# Patient Record
Sex: Male | Born: 1965 | Hispanic: No | Marital: Single | Smoking: Never smoker
Health system: Southern US, Community
[De-identification: ages and names within clinical notes are randomized; demographics above are authoritative.]

## PROBLEM LIST (undated history)

## (undated) DIAGNOSIS — E785 Hyperlipidemia, unspecified: Secondary | ICD-10-CM

## (undated) DIAGNOSIS — I10 Essential (primary) hypertension: Secondary | ICD-10-CM

## (undated) DIAGNOSIS — G4733 Obstructive sleep apnea (adult) (pediatric): Secondary | ICD-10-CM

## (undated) DIAGNOSIS — K219 Gastro-esophageal reflux disease without esophagitis: Secondary | ICD-10-CM

## (undated) HISTORY — DX: Essential (primary) hypertension: I10

## (undated) HISTORY — PX: SEPTOPLASTY: SUR1290

## (undated) HISTORY — DX: Gastro-esophageal reflux disease without esophagitis: K21.9

## (undated) HISTORY — DX: Obstructive sleep apnea (adult) (pediatric): G47.33

## (undated) HISTORY — DX: Hyperlipidemia, unspecified: E78.5

---

## 2006-10-14 ENCOUNTER — Ambulatory Visit: Payer: Self-pay | Admitting: Family Medicine

## 2007-10-16 ENCOUNTER — Ambulatory Visit: Payer: Self-pay | Admitting: Unknown Physician Specialty

## 2007-12-27 ENCOUNTER — Ambulatory Visit: Payer: Self-pay | Admitting: Family Medicine

## 2008-05-29 ENCOUNTER — Ambulatory Visit: Payer: Self-pay | Admitting: Family Medicine

## 2008-11-08 ENCOUNTER — Ambulatory Visit: Payer: Self-pay | Admitting: Family Medicine

## 2009-04-14 ENCOUNTER — Encounter: Payer: Self-pay | Admitting: Cardiovascular Disease

## 2009-04-14 LAB — CONVERTED CEMR LAB
ALT: 40 units/L
AST: 27 units/L
Albumin: 5 g/dL
Alkaline Phosphatase: 63 units/L
BUN: 15 mg/dL
Basophils Relative: 1 %
CO2: 26 meq/L
Chloride: 103 meq/L
Creatinine, Ser: 1.17 mg/dL
Eosinophils Relative: 3 %
HCT: 46.6 %
HDL: 49 mg/dL
Monocytes Relative: 9 %
RBC: 4.96 M/uL
Total Protein: 7.2 g/dL
Triglyceride fasting, serum: 132 mg/dL
WBC: 7.2 10*3/uL

## 2010-06-01 ENCOUNTER — Encounter: Payer: Self-pay | Admitting: Cardiovascular Disease

## 2010-06-01 DIAGNOSIS — I10 Essential (primary) hypertension: Secondary | ICD-10-CM

## 2010-06-01 DIAGNOSIS — E785 Hyperlipidemia, unspecified: Secondary | ICD-10-CM | POA: Insufficient documentation

## 2010-06-01 DIAGNOSIS — E663 Overweight: Secondary | ICD-10-CM

## 2010-06-03 ENCOUNTER — Encounter: Payer: Self-pay | Admitting: Cardiovascular Disease

## 2011-01-11 NOTE — Letter (Signed)
Summary: Historic Patient File  Historic Patient File   Imported By: [place] 06/03/2010 12:19:52  _____________________________________________________________________  External Attachment:    Type:   Image     Comment:   External Document

## 2011-11-07 ENCOUNTER — Ambulatory Visit: Payer: Self-pay | Admitting: Urology

## 2011-11-10 ENCOUNTER — Ambulatory Visit (INDEPENDENT_AMBULATORY_CARE_PROVIDER_SITE_OTHER): Payer: BC Managed Care – PPO | Admitting: Cardiovascular Disease

## 2011-11-10 ENCOUNTER — Ambulatory Visit: Payer: Self-pay | Admitting: Cardiovascular Disease

## 2011-11-10 ENCOUNTER — Encounter: Payer: Self-pay | Admitting: Cardiovascular Disease

## 2011-11-10 DIAGNOSIS — I10 Essential (primary) hypertension: Secondary | ICD-10-CM

## 2011-11-10 DIAGNOSIS — R002 Palpitations: Secondary | ICD-10-CM | POA: Insufficient documentation

## 2011-11-10 DIAGNOSIS — E663 Overweight: Secondary | ICD-10-CM

## 2011-11-10 DIAGNOSIS — R0789 Other chest pain: Secondary | ICD-10-CM

## 2011-11-10 DIAGNOSIS — R0602 Shortness of breath: Secondary | ICD-10-CM

## 2011-11-10 DIAGNOSIS — E785 Hyperlipidemia, unspecified: Secondary | ICD-10-CM

## 2011-11-10 NOTE — Assessment & Plan Note (Signed)
Cholesterol is at goal on the current lipid regimen. No changes to the medications were made.  

## 2011-11-10 NOTE — Assessment & Plan Note (Signed)
I've asked him to watch his blood pressure and if he continues to lose weight, we might have to cut the lisinopril in half.

## 2011-11-10 NOTE — Patient Instructions (Signed)
You are doing well. No medication changes were made.  Please call us if you have new issues that need to be addressed before your next appt.  The office will contact you for a follow up Appt. In 12 months  

## 2011-11-10 NOTE — Assessment & Plan Note (Signed)
We have complemented him on his weight loss and more strict diet.

## 2011-11-10 NOTE — Progress Notes (Signed)
Patient ID: Bradley Park, male    DOB: 01-22-1966, 45 y.o.   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 He does have a very bad cold and his visit was abbreviated.   He reports that he was recently on a trip to a [place] and he had a full day of hiking, other activities, using a spot, overeating and alcohol. Later that evening he felt dizzy and had to lie down and take a cool shower. He walked 1/2 mile to dinner and felt some arrhythmia. He had additional symptoms the next day that waxed and waned. He flew on that day and has since felt well with no further symptoms. He feels that maybe he overdid it and could have had some arrhythmia.  Otherwise he has been very well, is exercising and has lost weight, approximately 20 pounds.  EKG shows normal sinus rhythm with rate 76 beats per minute with incomplete right bundle branch block, no other significant ST or T wave changes  Previous stress test in July 2009 showing diaphragmatic attenuation artifact, no ischemia, ejection fraction 70%   Outpatient Encounter Prescriptions as of 11/10/2011  Medication Sig Dispense Refill  . lisinopril (PRINIVIL,ZESTRIL) 10 MG tablet Take 10 mg by mouth daily.        . pantoprazole (PROTONIX) 20 MG tablet Take 20 mg by mouth daily.        . rosuvastatin (CRESTOR) 10 MG tablet Take 10 mg by mouth daily.        Marland Kitchen testosterone cypionate (DEPOTESTOTERONE CYPIONATE) 200 MG/ML injection Inject 200 mg into the muscle every 14 (fourteen) days.           Review of Systems  Constitutional: Negative.   HENT: Negative.   Eyes: Negative.   Respiratory: Negative.   Cardiovascular: Positive for palpitations.  Gastrointestinal: Negative.   Musculoskeletal: Negative.   Skin: Negative.   Neurological: Positive for dizziness.  Hematological:  Negative.   Psychiatric/Behavioral: Negative.   All other systems reviewed and are negative.    BP 122/86  Pulse 76  Ht 6' (1.829 m)  Wt 251 lb 4 oz (113.966 kg)  BMI 34.08 kg/m2  Physical Exam  Nursing note and vitals reviewed. Constitutional: He is oriented to person, place, and time. He appears well-developed and well-nourished.  HENT:  Head: Normocephalic.  Nose: Nose normal.  Mouth/Throat: Oropharynx is clear and moist.  Eyes: Conjunctivae are normal. Pupils are equal, round, and reactive to light.  Neck: Normal range of motion. Neck supple. No JVD present.  Cardiovascular: Normal rate, regular rhythm, S1 normal, S2 normal, normal heart sounds and intact distal pulses.  Exam reveals no gallop and no friction rub.   No murmur heard. Pulmonary/Chest: Effort normal and breath sounds normal. No respiratory distress. He has no wheezes. He has no rales. He exhibits no tenderness.  Abdominal: Soft. Bowel sounds are normal. He exhibits no distension. There is no tenderness.  Musculoskeletal: Normal range of motion. He exhibits no edema and no tenderness.  Lymphadenopathy:    He has no cervical adenopathy.  Neurological: He is alert and oriented to person, place, and time. Coordination normal.  Skin: Skin is warm and dry. No rash noted. No erythema.  Psychiatric: He has a normal mood and affect. His behavior is normal. Judgment and thought content normal.  Assessment and Plan

## 2011-11-10 NOTE — Assessment & Plan Note (Signed)
Etiology of his dizziness and palpitations it is uncertain. Certainly could have been ectopy, possibly even atrial fibrillation. He can't he feels well and has no further symptoms. We have suggested to him that if he has additional episodes, that he call the office for a Holter monitor.

## 2012-08-02 IMAGING — CR DG CHEST 2V
1 series · 2 of 2 positions shown · non-contrast
Comparison: none

REASON FOR EXAM: sob
COMMENTS:

PROCEDURE:     DXR - DXR CHEST PA (OR AP) AND LATERAL  - November 07, 2011  [DATE]
RESULT:     Comparison: 12/27/2007

[Series 1: view not recorded · 0.17mm/px · 2 of 2 slices shown]
[im 1/2]
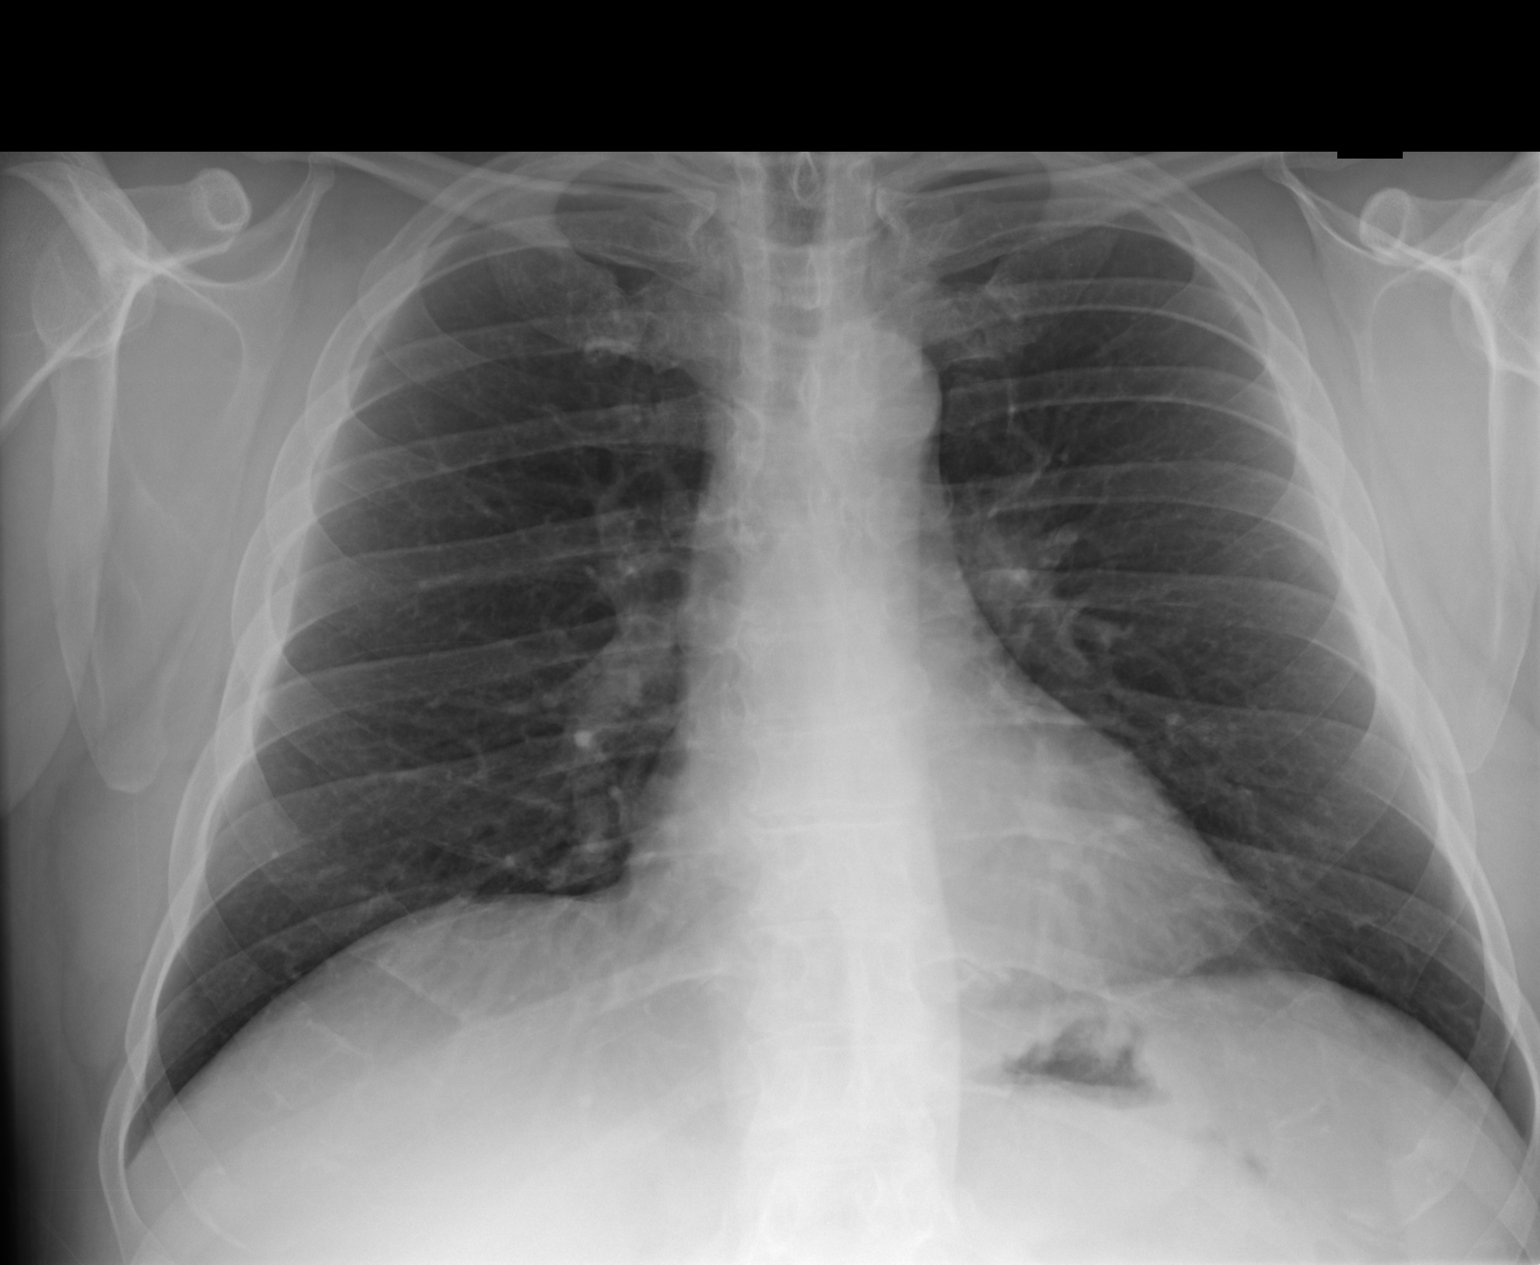
[im 2/2]
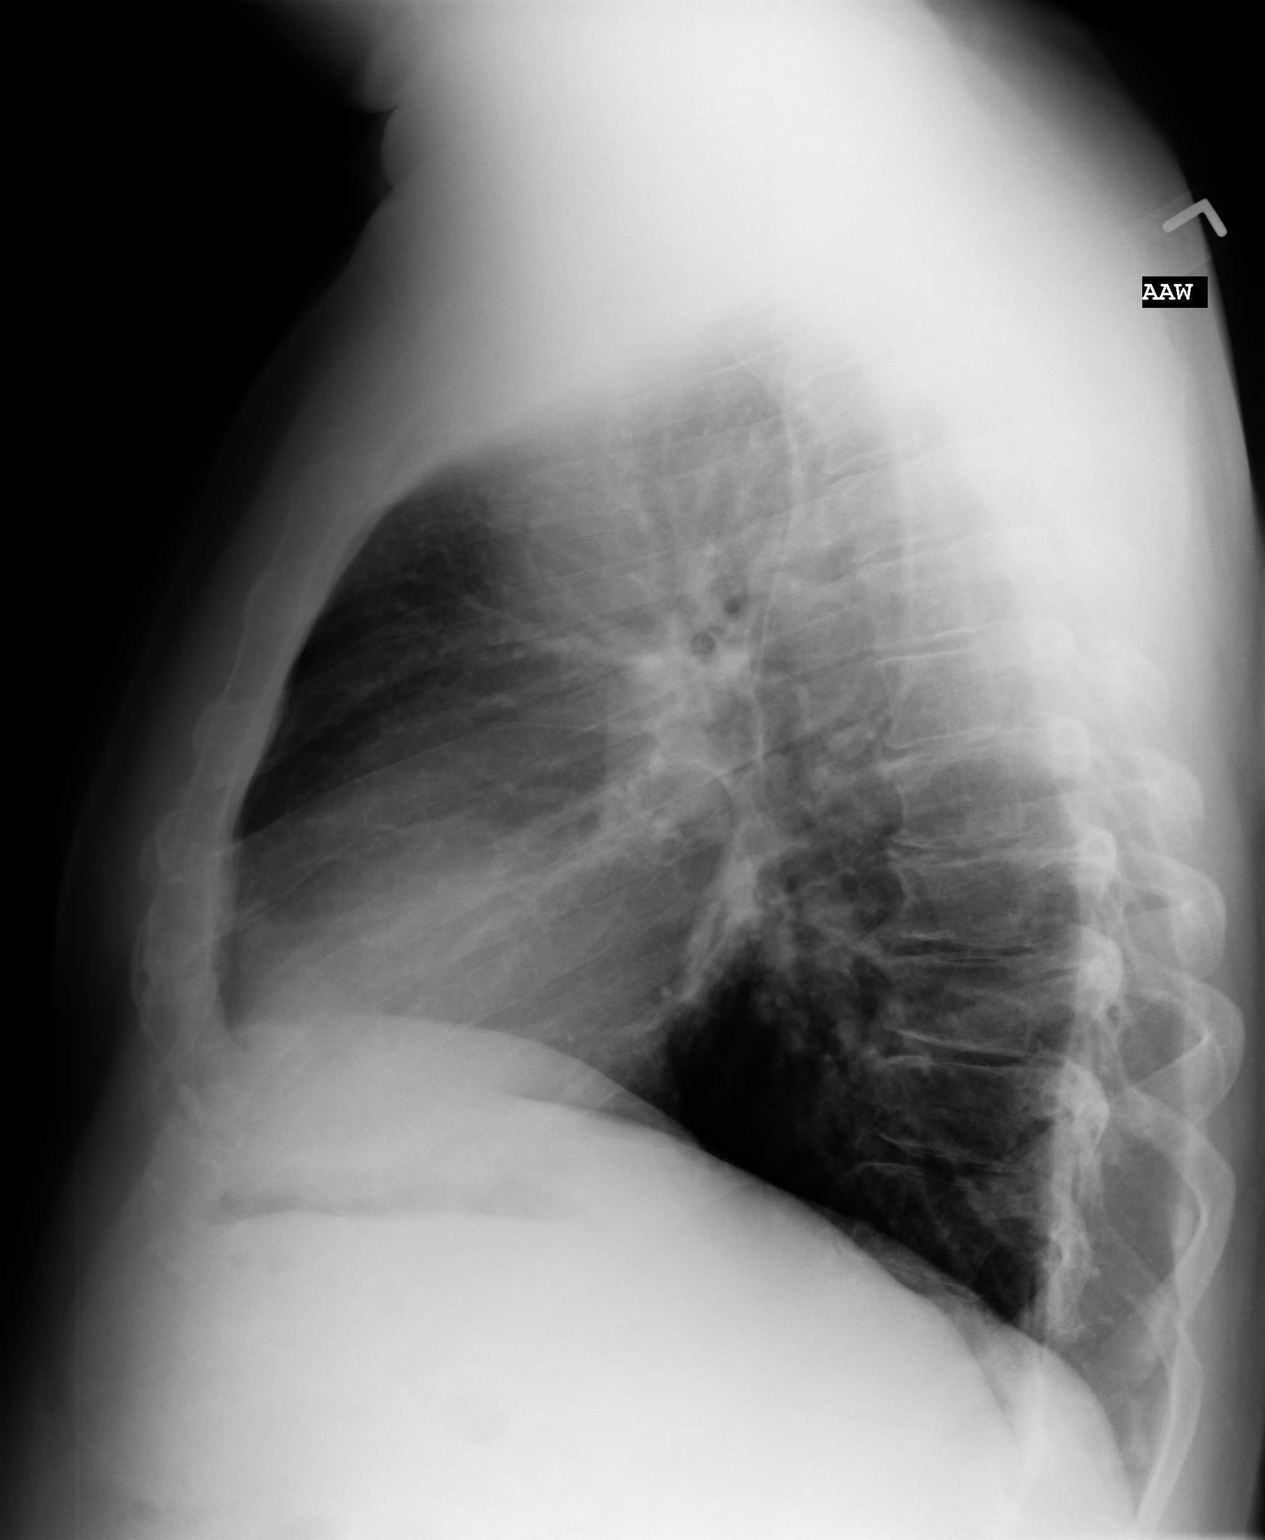

[2 of 2 positions shown; findings below may reference images not displayed]

FINDINGS: The heart and mediastinum are stable. The lungs are clear.
IMPRESSION: No acute cardiopulmonary disease.

## 2014-03-20 ENCOUNTER — Ambulatory Visit: Payer: Self-pay | Admitting: Oncology

## 2014-03-21 ENCOUNTER — Ambulatory Visit: Payer: Self-pay | Admitting: Oncology

## 2014-04-11 ENCOUNTER — Ambulatory Visit: Payer: Self-pay | Admitting: Oncology

## 2015-04-29 ENCOUNTER — Ambulatory Visit: Payer: BC Managed Care – PPO | Attending: Otolaryngology

## 2015-04-29 DIAGNOSIS — G4733 Obstructive sleep apnea (adult) (pediatric): Secondary | ICD-10-CM | POA: Insufficient documentation

## 2015-11-24 ENCOUNTER — Other Ambulatory Visit: Payer: Self-pay

## 2015-11-24 MED ORDER — LISINOPRIL 10 MG PO TABS: 10.0000 mg | ORAL_TABLET | Freq: Every day | ORAL | Status: DC

## 2015-11-24 MED ORDER — ROSUVASTATIN CALCIUM 10 MG PO TABS: 10.0000 mg | ORAL_TABLET | Freq: Every day | ORAL | Status: DC

## 2015-12-24 ENCOUNTER — Other Ambulatory Visit: Payer: Self-pay

## 2016-01-20 ENCOUNTER — Other Ambulatory Visit: Payer: Self-pay

## 2016-01-20 MED ORDER — PANTOPRAZOLE SODIUM 20 MG PO TBEC: 20.0000 mg | DELAYED_RELEASE_TABLET | Freq: Every day | ORAL | Status: DC

## 2016-02-29 ENCOUNTER — Other Ambulatory Visit: Payer: Self-pay | Admitting: Family Medicine

## 2016-02-29 ENCOUNTER — Other Ambulatory Visit: Payer: Self-pay

## 2016-02-29 MED ORDER — LISINOPRIL 10 MG PO TABS: 10.0000 mg | ORAL_TABLET | Freq: Every day | ORAL | Status: DC

## 2016-03-08 ENCOUNTER — Ambulatory Visit (INDEPENDENT_AMBULATORY_CARE_PROVIDER_SITE_OTHER): Payer: BC Managed Care – PPO | Admitting: Family Medicine

## 2016-03-08 ENCOUNTER — Encounter: Payer: Self-pay | Admitting: Family Medicine

## 2016-03-08 VITALS — BP 138/88 | HR 80 | Ht 72.0 in | Wt 259.0 lb

## 2016-03-08 DIAGNOSIS — Z0000 Encounter for general adult medical examination without abnormal findings: Secondary | ICD-10-CM | POA: Diagnosis not present

## 2016-03-08 DIAGNOSIS — R351 Nocturia: Secondary | ICD-10-CM

## 2016-03-08 DIAGNOSIS — E291 Testicular hypofunction: Secondary | ICD-10-CM | POA: Diagnosis not present

## 2016-03-08 DIAGNOSIS — E785 Hyperlipidemia, unspecified: Secondary | ICD-10-CM | POA: Diagnosis not present

## 2016-03-08 DIAGNOSIS — N401 Enlarged prostate with lower urinary tract symptoms: Secondary | ICD-10-CM | POA: Diagnosis not present

## 2016-03-08 DIAGNOSIS — R69 Illness, unspecified: Secondary | ICD-10-CM | POA: Diagnosis not present

## 2016-03-08 DIAGNOSIS — I10 Essential (primary) hypertension: Secondary | ICD-10-CM | POA: Diagnosis not present

## 2016-03-08 DIAGNOSIS — E663 Overweight: Secondary | ICD-10-CM

## 2016-03-08 DIAGNOSIS — E349 Endocrine disorder, unspecified: Secondary | ICD-10-CM

## 2016-03-08 LAB — HEMOCCULT GUIAC POC 1CARD (OFFICE): FECAL OCCULT BLD: NEGATIVE

## 2016-03-08 MED ORDER — PANTOPRAZOLE SODIUM 20 MG PO TBEC: 20.0000 mg | DELAYED_RELEASE_TABLET | Freq: Every day | ORAL | Status: DC

## 2016-03-08 MED ORDER — LISINOPRIL 10 MG PO TABS: 10.0000 mg | ORAL_TABLET | Freq: Every day | ORAL | Status: DC

## 2016-03-08 MED ORDER — TESTOSTERONE CYPIONATE 200 MG/ML IM SOLN: 200.0000 mg | INTRAMUSCULAR | Status: DC

## 2016-03-08 MED ORDER — ROSUVASTATIN CALCIUM 10 MG PO TABS: 10.0000 mg | ORAL_TABLET | Freq: Every day | ORAL | Status: DC

## 2016-03-08 MED ORDER — SILDENAFIL CITRATE 20 MG PO TABS: 20.0000 mg | ORAL_TABLET | ORAL | Status: DC | PRN

## 2016-03-08 NOTE — Progress Notes (Signed)
Name: Bradley Park   MRN: [id number]    DOB: 21-Aug-1966   Date:03/08/2016       Progress Note  Subjective  Chief Complaint  Chief Complaint  Patient presents with  . Hyperlipidemia  . Hypertension  . Gastroesophageal Reflux  . Hypogonadism  . Annual Exam    HPI Comments: Patient presents for annual physical exam.  Hyperlipidemia This is a chronic problem. The problem is controlled. Recent lipid tests were reviewed and are normal. He has no history of chronic renal disease, diabetes, hypothyroidism, liver disease, obesity or nephrotic syndrome. There are no known factors aggravating his hyperlipidemia. Associated symptoms include a focal sensory loss, a focal weakness and myalgias. Pertinent negatives include no chest pain, leg pain or shortness of breath. Current antihyperlipidemic treatment includes statins. The current treatment provides moderate improvement of lipids. There are no compliance problems.   Hypertension This is a chronic problem. The current episode started more than 1 year ago. The problem is unchanged. The problem is controlled. Associated symptoms include neck pain. Pertinent negatives include no anxiety, blurred vision, chest pain, headaches, malaise/fatigue, orthopnea, palpitations, peripheral edema, PND, shortness of breath or sweats. Associated agents: hormone. There are no known risk factors for coronary artery disease. Past treatments include ACE inhibitors. The current treatment provides mild improvement. There are no compliance problems.  There is no history of angina, kidney disease, CAD/MI, CVA, heart failure, left ventricular hypertrophy, PVD or retinopathy. There is no history of chronic renal disease.  Gastroesophageal Reflux He reports no abdominal pain, no chest pain, no choking, no coughing, no dysphagia, no early satiety, no heartburn, no hoarse voice, no nausea, no sore throat or no wheezing. This is a recurrent problem. The current episode started more than  1 year ago. The problem occurs frequently. The problem has been gradually improving. The symptoms are aggravated by certain foods. Pertinent negatives include no melena or weight loss. There are no known risk factors. He has tried a PPI (cpap relief) for the symptoms. The treatment provided moderate relief.    No problem-specific assessment & plan notes found for this encounter.   Past Medical History  Diagnosis Date  . Asthma   . Obstructive sleep apnea   . GERD (gastroesophageal reflux disease)   . Hyperlipidemia   . Hypertension     Past Surgical History  Procedure Laterality Date  . Septoplasty      History reviewed. No pertinent family history.  Social History   Social History  . Marital Status: Single    Spouse Name: N/A  . Number of Children: N/A  . Years of Education: N/A   Occupational History  . Not on file.   Social History Main Topics  . Smoking status: Never Smoker   . Smokeless tobacco: Not on file  . Alcohol Use: 0.5 oz/week    1 drink(s) per week  . Drug Use: No  . Sexual Activity: Not on file   Other Topics Concern  . Not on file   Social History Narrative    No Known Allergies   Review of Systems  Constitutional: Negative for fever, chills, weight loss and malaise/fatigue.  HENT: Negative for ear discharge, ear pain, hoarse voice and sore throat.   Eyes: Negative for blurred vision.  Respiratory: Negative for cough, sputum production, choking, shortness of breath and wheezing.   Cardiovascular: Negative for chest pain, palpitations, orthopnea, leg swelling and PND.  Gastrointestinal: Negative for heartburn, dysphagia, nausea, abdominal pain, diarrhea, constipation, blood in stool  and melena.  Genitourinary: Negative for dysuria, urgency, frequency and hematuria.  Musculoskeletal: Positive for myalgias and neck pain. Negative for back pain and joint pain.  Skin: Negative for rash.  Neurological: Positive for tingling and focal weakness.  Negative for dizziness, sensory change and headaches.  Endo/Heme/Allergies: Negative for environmental allergies and polydipsia. Does not bruise/bleed easily.  Psychiatric/Behavioral: Positive for depression. Negative for suicidal ideas. The patient is not nervous/anxious and does not have insomnia.      Objective  Filed Vitals:   03/08/16 0832  BP: 138/88  Pulse: 80  Height: 6' (1.829 m)  Weight: 259 lb (117.482 kg)    Physical Exam  Constitutional: He is oriented to person, place, and time and well-developed, well-nourished, and in no distress.  HENT:  Head: Normocephalic.  Right Ear: External ear normal.  Left Ear: External ear normal.  Nose: Nose normal.  Mouth/Throat: Oropharynx is clear and moist.  Eyes: Conjunctivae and EOM are normal. Pupils are equal, round, and reactive to light. Right eye exhibits no discharge. Left eye exhibits no discharge. No scleral icterus.  Neck: Normal range of motion. Neck supple. No JVD present. No tracheal deviation present. No thyromegaly present.  Cardiovascular: Normal rate, regular rhythm, normal heart sounds and intact distal pulses.  Exam reveals no gallop and no friction rub.   No murmur heard. Pulmonary/Chest: Breath sounds normal. No respiratory distress. He has no wheezes. He has no rales.  Abdominal: Soft. Bowel sounds are normal. He exhibits no mass. There is no hepatosplenomegaly. There is no tenderness. There is no rebound, no guarding and no CVA tenderness.  Genitourinary: Rectum normal, prostate normal, testes/scrotum normal and penis normal. Rectal exam shows no external hemorrhoid, no mass and no tenderness.  Musculoskeletal: Normal range of motion. He exhibits no edema or tenderness.  Lymphadenopathy:    He has no cervical adenopathy.  Neurological: He is alert and oriented to person, place, and time. He has normal sensation, normal strength, normal reflexes and intact cranial nerves. No cranial nerve deficit.  Skin: Skin is  warm. No rash noted.  Psychiatric: Mood and affect normal.  Nursing note and vitals reviewed.     Assessment & Plan  Problem List Items Addressed This Visit      Cardiovascular and Mediastinum   Essential hypertension   Relevant Medications   lisinopril (PRINIVIL,ZESTRIL) 10 MG tablet   rosuvastatin (CRESTOR) 10 MG tablet   sildenafil (REVATIO) 20 MG tablet   Other Relevant Orders   Renal Function Panel   POCT Occult Blood Stool (Completed)     Other   Hyperlipidemia   Relevant Medications   lisinopril (PRINIVIL,ZESTRIL) 10 MG tablet   rosuvastatin (CRESTOR) 10 MG tablet   sildenafil (REVATIO) 20 MG tablet   Other Relevant Orders   Lipid Profile   Overweight    Other Visit Diagnoses    Annual physical exam    -  Primary    Relevant Orders    POCT Occult Blood Stool (Completed)    Taking multiple medications for chronic disease        Relevant Orders    CBC    POCT Occult Blood Stool (Completed)    Hypotestosteronemia        Relevant Orders    Testosterone Total,Free,Bio, Males    BPH associated with nocturia        Relevant Orders    PSA         Dr. Hayden Rasmusseneanna Nayelli Inglis [facility]  03/08/2016    

## 2016-03-09 LAB — LIPID PANEL
CHOL/HDL RATIO: 3.9 ratio (ref 0.0–5.0)
Cholesterol, Total: 190 mg/dL (ref 100–199)
HDL: 49 mg/dL (ref >39)
LDL CALC: 109 mg/dL — AB (ref 0–99)
TRIGLYCERIDES: 161 mg/dL — AB (ref 0–149)
VLDL Cholesterol Cal: 32 mg/dL (ref 5–40)

## 2016-03-09 LAB — CBC
HEMATOCRIT: 52.2 % — AB (ref 37.5–51.0)
Hemoglobin: 18.1 g/dL — ABNORMAL HIGH (ref 12.6–17.7)
MCH: 33.2 pg — ABNORMAL HIGH (ref 26.6–33.0)
MCHC: 34.7 g/dL (ref 31.5–35.7)
MCV: 96 fL (ref 79–97)
Platelets: 168 10*3/uL (ref 150–379)
RBC: 5.46 x10E6/uL (ref 4.14–5.80)
RDW: 14.9 % (ref 12.3–15.4)
WBC: 6.6 10*3/uL (ref 3.4–10.8)

## 2016-03-09 LAB — RENAL FUNCTION PANEL
ALBUMIN: 4.7 g/dL (ref 3.5–5.5)
BUN/Creatinine Ratio: 12 (ref 9–20)
BUN: 13 mg/dL (ref 6–24)
CHLORIDE: 99 mmol/L (ref 96–106)
CO2: 25 mmol/L (ref 18–29)
CREATININE: 1.1 mg/dL (ref 0.76–1.27)
Calcium: 9.1 mg/dL (ref 8.7–10.2)
GFR, EST AFRICAN AMERICAN: 89 mL/min/{1.73_m2} (ref >59)
GFR, EST NON AFRICAN AMERICAN: 77 mL/min/{1.73_m2} (ref >59)
Glucose: 107 mg/dL — ABNORMAL HIGH (ref 65–99)
Phosphorus: 2.9 mg/dL (ref 2.5–4.5)
Potassium: 4.5 mmol/L (ref 3.5–5.2)
Sodium: 143 mmol/L (ref 134–144)

## 2016-03-09 LAB — PSA: Prostate Specific Ag, Serum: 0.6 ng/mL (ref 0.0–4.0)

## 2016-03-10 ENCOUNTER — Other Ambulatory Visit: Payer: Self-pay

## 2016-03-14 ENCOUNTER — Other Ambulatory Visit: Payer: Self-pay

## 2016-03-14 MED ORDER — TADALAFIL 20 MG PO TABS: 20.0000 mg | ORAL_TABLET | Freq: Every day | ORAL | Status: DC | PRN

## 2016-03-22 ENCOUNTER — Other Ambulatory Visit: Payer: Self-pay

## 2016-03-24 ENCOUNTER — Other Ambulatory Visit: Payer: Self-pay

## 2016-05-13 ENCOUNTER — Other Ambulatory Visit: Payer: Self-pay | Admitting: Family Medicine

## 2016-07-21 ENCOUNTER — Other Ambulatory Visit: Payer: Self-pay

## 2016-08-23 ENCOUNTER — Other Ambulatory Visit: Payer: Self-pay | Admitting: Family Medicine

## 2016-09-26 ENCOUNTER — Other Ambulatory Visit: Payer: Self-pay

## 2016-09-26 MED ORDER — TIZANIDINE HCL 4 MG PO CAPS: 4.0000 mg | ORAL_CAPSULE | Freq: Three times a day (TID) | ORAL | 4 refills | Status: DC

## 2016-11-10 ENCOUNTER — Other Ambulatory Visit: Payer: Self-pay | Admitting: Family Medicine

## 2017-03-09 ENCOUNTER — Ambulatory Visit (INDEPENDENT_AMBULATORY_CARE_PROVIDER_SITE_OTHER): Payer: BC Managed Care – PPO | Admitting: Family Medicine

## 2017-03-09 VITALS — BP 138/60 | HR 88 | Ht 72.0 in | Wt 263.0 lb

## 2017-03-09 DIAGNOSIS — E291 Testicular hypofunction: Secondary | ICD-10-CM | POA: Insufficient documentation

## 2017-03-09 DIAGNOSIS — E785 Hyperlipidemia, unspecified: Secondary | ICD-10-CM | POA: Diagnosis not present

## 2017-03-09 DIAGNOSIS — N401 Enlarged prostate with lower urinary tract symptoms: Secondary | ICD-10-CM | POA: Diagnosis not present

## 2017-03-09 DIAGNOSIS — K219 Gastro-esophageal reflux disease without esophagitis: Secondary | ICD-10-CM

## 2017-03-09 DIAGNOSIS — R351 Nocturia: Secondary | ICD-10-CM | POA: Diagnosis not present

## 2017-03-09 DIAGNOSIS — I10 Essential (primary) hypertension: Secondary | ICD-10-CM | POA: Diagnosis not present

## 2017-03-09 DIAGNOSIS — Z0000 Encounter for general adult medical examination without abnormal findings: Secondary | ICD-10-CM | POA: Diagnosis not present

## 2017-03-09 DIAGNOSIS — M5090 Cervical disc disorder, unspecified, unspecified cervical region: Secondary | ICD-10-CM | POA: Diagnosis not present

## 2017-03-09 MED ORDER — PANTOPRAZOLE SODIUM 40 MG PO TBEC: 40.0000 mg | DELAYED_RELEASE_TABLET | Freq: Every day | ORAL | 99 refills | Status: DC

## 2017-03-09 MED ORDER — TIZANIDINE HCL 4 MG PO CAPS: 4.0000 mg | ORAL_CAPSULE | Freq: Three times a day (TID) | ORAL | 4 refills | Status: DC

## 2017-03-09 MED ORDER — ROSUVASTATIN CALCIUM 10 MG PO TABS: 10.0000 mg | ORAL_TABLET | Freq: Every day | ORAL | 99 refills | Status: DC

## 2017-03-09 MED ORDER — TESTOSTERONE CYPIONATE 200 MG/ML IM SOLN: 200.0000 mg | INTRAMUSCULAR | 3 refills | Status: DC

## 2017-03-09 MED ORDER — LISINOPRIL 10 MG PO TABS: 10.0000 mg | ORAL_TABLET | Freq: Every day | ORAL | 99 refills | Status: DC

## 2017-03-09 NOTE — Progress Notes (Signed)
Name: Bradley Park   MRN: [id number]    DOB: May 07, 1966   Date:03/09/2017       Progress Note  Subjective  Chief Complaint  Chief Complaint  Patient presents with  . Annual Exam  . hypotestosterone    Patient presents for annual physical exam.     No problem-specific Assessment & Plan notes found for this encounter.   Past Medical History:  Diagnosis Date  . Asthma   . GERD (gastroesophageal reflux disease)   . Hyperlipidemia   . Hypertension   . Obstructive sleep apnea     Past Surgical History:  Procedure Laterality Date  . SEPTOPLASTY      No family history on file.  Social History   Social History  . Marital status: Single    Spouse name: N/A  . Number of children: N/A  . Years of education: N/A   Occupational History  . Not on file.   Social History Main Topics  . Smoking status: Never Smoker  . Smokeless tobacco: Not on file  . Alcohol use 0.5 oz/week    1 drink(s) per week  . Drug use: No  . Sexual activity: Not on file   Other Topics Concern  . Not on file   Social History Narrative  . No narrative on file    No Known Allergies  Outpatient Medications Prior to Visit  Medication Sig Dispense Refill  . sildenafil (REVATIO) 20 MG tablet TAKE 1 TABLET BY MOUTH ONCE DAILY AS NEEDED 90 tablet PRN  . tadalafil (CIALIS) 20 MG tablet Take 1 tablet (20 mg total) by mouth daily as needed for erectile dysfunction. (Patient taking differently: Take 20 mg by mouth daily as needed for erectile dysfunction. Gets elsewhere) 10 tablet 0  . lisinopril (PRINIVIL,ZESTRIL) 10 MG tablet TAKE 1 TABLET BY MOUTH ONCE DAILY 90 tablet PRN  . pantoprazole (PROTONIX) 20 MG tablet Take 1 tablet (20 mg total) by mouth daily. 90 tablet 0  . pantoprazole (PROTONIX) 40 MG tablet TAKE 1 TABLET BY MOUTH ONCE DAILY 90 tablet 0  . rosuvastatin (CRESTOR) 10 MG tablet TAKE 1 TABLET BY MOUTH ONCE DAILY 90 tablet PRN  . testosterone cypionate (DEPOTESTOSTERONE CYPIONATE) 200 MG/ML  injection Inject 1 mL (200 mg total) into the muscle every 14 (fourteen) days. 10 mL 3  . tiZANidine (ZANAFLEX) 4 MG capsule Take 1 capsule (4 mg total) by mouth 3 (three) times daily. 30 capsule 4   No facility-administered medications prior to visit.     Review of Systems  Constitutional: Negative for chills, fever, malaise/fatigue and weight loss.  HENT: Positive for congestion and sinus pain. Negative for ear discharge, ear pain, nosebleeds and sore throat.   Eyes: Positive for redness. Negative for blurred vision, photophobia and pain.  Respiratory: Negative for cough, sputum production, shortness of breath, wheezing and stridor.   Cardiovascular: Negative for chest pain, palpitations and leg swelling.  Gastrointestinal: Negative for abdominal pain, blood in stool, constipation, diarrhea, heartburn, melena and nausea.  Genitourinary: Negative for dysuria, frequency, hematuria and urgency.  Musculoskeletal: Positive for joint pain and neck pain. Negative for back pain and myalgias.  Skin: Negative for rash.  Neurological: Positive for sensory change. Negative for dizziness, tingling, speech change, focal weakness, seizures, weakness and headaches.  Endo/Heme/Allergies: Negative for environmental allergies and polydipsia. Does not bruise/bleed easily.  Psychiatric/Behavioral: Negative for depression and suicidal ideas. The patient is not nervous/anxious and does not have insomnia.      Objective  Vitals:  03/09/17 0822  BP: 138/60  Pulse: 88  Weight: 263 lb (119.3 kg)  Height: 6' (1.829 m)    Physical Exam  Constitutional: He is oriented to person, place, and time and well-developed, well-nourished, and in no distress. Vital signs are normal.  HENT:  Head: Normocephalic.  Right Ear: Tympanic membrane, external ear and ear canal normal. No decreased hearing is noted.  Left Ear: Tympanic membrane, external ear and ear canal normal. No decreased hearing is noted.  Nose: Nose  normal.  Mouth/Throat: Uvula is midline, oropharynx is clear and moist and mucous membranes are normal. No oropharyngeal exudate, posterior oropharyngeal edema or posterior oropharyngeal erythema.  Eyes: EOM and lids are normal. Right eye exhibits no discharge. Left eye exhibits no discharge. Right conjunctiva is not injected. Left conjunctiva is injected. Left conjunctiva has a hemorrhage. No scleral icterus.  Fundoscopic exam:      The right eye shows no arteriolar narrowing, no AV nicking and no papilledema.       The left eye shows no arteriolar narrowing, no AV nicking and no papilledema.  Neck: Trachea normal and normal range of motion. Neck supple. Normal carotid pulses, no hepatojugular reflux and no JVD present. Carotid bruit is not present. No tracheal deviation present. No thyromegaly present.  Cardiovascular: Normal rate, regular rhythm, S1 normal, S2 normal, normal heart sounds, intact distal pulses and normal pulses.  PMI is not displaced.  Exam reveals no gallop and no friction rub.   No murmur heard. Pulmonary/Chest: Breath sounds normal. No respiratory distress. He has no wheezes. He has no rales.  Abdominal: Soft. Normal aorta and bowel sounds are normal. He exhibits no distension and no mass. There is no hepatosplenomegaly. There is no tenderness. There is no rebound, no guarding and no CVA tenderness.  Genitourinary: Rectum normal, prostate normal, testes/scrotum normal and penis normal. Rectal exam shows guaiac negative stool. He exhibits no abnormal testicular mass and no abnormal scrotal mass.  Musculoskeletal: Normal range of motion. He exhibits no edema or tenderness.  Lymphadenopathy:    He has no cervical adenopathy.    He has no axillary adenopathy.  Neurological: He is alert and oriented to person, place, and time. He has normal sensation, normal strength, normal reflexes and intact cranial nerves. No cranial nerve deficit.  Skin: Skin is warm, dry and intact. No rash  noted.  Psychiatric: Mood, memory, affect and judgment normal.      Assessment & Plan  Problem List Items Addressed This Visit      Cardiovascular and Mediastinum   Essential hypertension   Relevant Medications   rosuvastatin (CRESTOR) 10 MG tablet   lisinopril (PRINIVIL,ZESTRIL) 10 MG tablet     Digestive   Gastroesophageal reflux disease   Relevant Medications   pantoprazole (PROTONIX) 40 MG tablet     Endocrine   Hypogonadism in male   Relevant Medications   testosterone cypionate (DEPOTESTOSTERONE CYPIONATE) 200 MG/ML injection     Musculoskeletal and Integument   Cervical disc disease   Relevant Medications   tiZANidine (ZANAFLEX) 4 MG capsule     Genitourinary   BPH associated with nocturia     Other   Hyperlipidemia   Relevant Medications   rosuvastatin (CRESTOR) 10 MG tablet   lisinopril (PRINIVIL,ZESTRIL) 10 MG tablet    Other Visit Diagnoses    Encounter for annual physical exam    -  Primary      Meds ordered this encounter  Medications  . rosuvastatin (CRESTOR) 10 MG tablet  Sig: Take 1 tablet (10 mg total) by mouth daily.    Dispense:  90 tablet    Refill:  PRN  . testosterone cypionate (DEPOTESTOSTERONE CYPIONATE) 200 MG/ML injection    Sig: Inject 1 mL (200 mg total) into the muscle every 14 (fourteen) days.    Dispense:  10 mL    Refill:  3  . tiZANidine (ZANAFLEX) 4 MG capsule    Sig: Take 1 capsule (4 mg total) by mouth 3 (three) times daily.    Dispense:  30 capsule    Refill:  4  . pantoprazole (PROTONIX) 40 MG tablet    Sig: Take 1 tablet (40 mg total) by mouth daily.    Dispense:  90 tablet    Refill:  PRN  . lisinopril (PRINIVIL,ZESTRIL) 10 MG tablet    Sig: Take 1 tablet (10 mg total) by mouth daily.    Dispense:  90 tablet    Refill:  PRN    sched appt      Dr. Elizabeth Sauer [facility]  03/09/17

## 2017-11-14 ENCOUNTER — Ambulatory Visit
Admission: EM | Admit: 2017-11-14 | Discharge: 2017-11-14 | Disposition: A | Discharge status: Home / Self Care | Payer: BC Managed Care – PPO | Attending: Family Medicine | Admitting: Family Medicine

## 2017-11-14 ENCOUNTER — Other Ambulatory Visit: Payer: Self-pay

## 2017-11-14 DIAGNOSIS — J029 Acute pharyngitis, unspecified: Secondary | ICD-10-CM

## 2017-11-14 LAB — RAPID STREP SCREEN (MED CTR MEBANE ONLY): STREPTOCOCCUS, GROUP A SCREEN (DIRECT): NEGATIVE

## 2017-11-14 MED ORDER — AMOXICILLIN-POT CLAVULANATE 875-125 MG PO TABS: 1.0000 | ORAL_TABLET | Freq: Two times a day (BID) | ORAL | 0 refills | Status: DC

## 2017-11-14 NOTE — ED Provider Notes (Signed)
[facility]    CSN: [id number] Arrival date & time: 11/14/17  [id number]   History   Chief Complaint Chief Complaint  Patient presents with  . Sore Throat   HPI  51 year old male presents with sore throat.  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  He comes in contact with many sick people.  He has had a severe sore throat since Thursday.  Initially improved and now has worsened.  Severe.  Difficulty talking and swallowing.  No associated fever.  No medications or interventions tried.  No known relieving factors.  No other associated symptoms.  No other complaints at this time.  Past Medical History:  Diagnosis Date  . Asthma   . GERD (gastroesophageal reflux disease)   . Hyperlipidemia   . Hypertension   . Obstructive sleep apnea    Patient Active Problem List   Diagnosis Date Noted  . Hypogonadism in male 03/09/2017  . Gastroesophageal reflux disease 03/09/2017  . BPH associated with nocturia 03/09/2017  . Cervical disc disease 03/09/2017  . Palpitations 11/10/2011  . Hyperlipidemia 06/01/2010  . Overweight 06/01/2010  . Essential hypertension 06/01/2010   Past Surgical History:  Procedure Laterality Date  . SEPTOPLASTY      Home Medications    Prior to Admission medications   Medication Sig Start Date End Date Taking? Authorizing Provider  lisinopril (PRINIVIL,ZESTRIL) 10 MG tablet Take 1 tablet (10 mg total) by mouth daily. 03/09/17  Yes Duanne LimerickJones, Deanna C, MD  pantoprazole (PROTONIX) 40 MG tablet Take 1 tablet (40 mg total) by mouth daily. 03/09/17  Yes Duanne LimerickJones, Deanna C, MD  rosuvastatin (CRESTOR) 10 MG tablet Take 1 tablet (10 mg total) by mouth daily. 03/09/17  Yes Duanne LimerickJones, Deanna C, MD  sildenafil (REVATIO) 20 MG tablet TAKE 1 TABLET BY MOUTH ONCE DAILY AS NEEDED 11/10/16  Yes Duanne LimerickJones, Deanna C, MD  tadalafil (CIALIS) 20 MG tablet Take 1 tablet (20 mg total) by mouth daily as needed for erectile dysfunction. Patient taking differently: Take 20 mg by mouth  daily as needed for erectile dysfunction. Gets elsewhere 03/14/16  Yes Duanne LimerickJones, Deanna C, MD  testosterone cypionate (DEPOTESTOSTERONE CYPIONATE) 200 MG/ML injection Inject 1 mL (200 mg total) into the muscle every 14 (fourteen) days. 03/09/17  Yes Duanne LimerickJones, Deanna C, MD  tiZANidine (ZANAFLEX) 4 MG capsule Take 1 capsule (4 mg total) by mouth 3 (three) times daily. 03/09/17  Yes Duanne LimerickJones, Deanna C, MD  amoxicillin-clavulanate (AUGMENTIN) 875-125 MG tablet Take 1 tablet by mouth every 12 (twelve) hours. 11/14/17   Tommie Samsook, [place] Dinneen G, DO    Family History No reported family hx.  Social History Social History   Tobacco Use  . Smoking status: Never Smoker  . Smokeless tobacco: Never Used  Substance Use Topics  . Alcohol use: Yes    Alcohol/week: 0.5 oz    Types: 1 Standard drinks or equivalent per week  . Drug use: No     Allergies   Patient has no known allergies.   Review of Systems Review of Systems  Constitutional: Negative for fever.  HENT: Positive for sore throat and trouble swallowing.   All other systems reviewed and are negative.  Physical Exam Triage Vital Signs ED Triage Vitals [11/14/17 0939]  Enc Vitals Group     BP (!) 161/95     Pulse Rate 88     Resp 18     Temp 98.8 F (37.1 C)     Temp Source Oral  SpO2 97 %     Weight 250 lb (113.4 kg)     Height 6' (1.829 m)     Head Circumference      Peak Flow      Pain Score 1     Pain Loc      Pain Edu?      Excl. in GC?    Updated Vital Signs BP (!) 161/95 (BP Location: Left Arm)   Pulse 88   Temp 98.8 F (37.1 C) (Oral)   Resp 18   Ht 6' (1.829 m)   Wt 250 lb (113.4 kg)   SpO2 97%   BMI 33.91 kg/m    Physical Exam  Constitutional: He is oriented to person, place, and time. He appears well-developed and well-nourished. He does not appear ill. No distress.  HENT:  Head: Normocephalic and atraumatic.  Right Ear: Tympanic membrane normal.  Left Ear: Tympanic membrane normal.  Mouth/Throat: No uvula swelling.    Severe oral pharyngeal erythema.  No exudate.  Uvula edema noted.  Uvula is midline.  No peritonsillar abscess.  Neck: Neck supple.  Cardiovascular: Normal rate and regular rhythm.  No murmur heard. Pulmonary/Chest: Effort normal and breath sounds normal. No respiratory distress. He has no wheezes. He has no rales.  Lymphadenopathy:    He has no cervical adenopathy.  Neurological: He is alert and oriented to person, place, and time.  Skin: Skin is warm. No rash noted.  Psychiatric: He has a normal mood and affect. His behavior is normal.  Vitals reviewed.  UC Treatments / Results  Labs (all labs ordered are listed, but only abnormal results are displayed) Labs Reviewed  RAPID STREP SCREEN (NOT AT [facility])  CULTURE, GROUP A STREP [facility])    EKG  EKG Interpretation None       Radiology No results found.  Procedures Procedures (including critical care time)  Medications Ordered in UC Medications - No data to display   Initial Impression / Assessment and Plan / UC Course  I have reviewed the triage vital signs and the nursing notes.  Pertinent labs & imaging results that were available during my care of the patient were reviewed by me and considered in my medical decision making (see chart for details).     51 year old male presents with severe pharyngitis.  Rapid strep negative.  Given his clinical exam and persistence of symptoms, I am treating him empirically with Augmentin.  Final Clinical Impressions(s) / UC Diagnoses   Final diagnoses:  Pharyngitis, unspecified etiology    ED Discharge Orders        Ordered    amoxicillin-clavulanate (AUGMENTIN) 875-125 MG tablet  Every 12 hours     11/14/17 1029     Controlled Substance Prescriptions [place] Controlled Substance Registry consulted? Not Applicable   Tommie SamsCook, Fallan Mccarey G, DO 11/14/17 1054

## 2017-11-14 NOTE — ED Triage Notes (Signed)
Patient complains of sore throat that started on Thursday. Patient states that it hurts when he swallows or talks. Denies fever.

## 2017-11-14 NOTE — Discharge Instructions (Signed)
Antibiotic as prescribed. ° °Hope you feel better ° °Dr. Saria Haran  °

## 2017-11-16 LAB — CULTURE, GROUP A STREP (THRC)

## 2017-11-17 ENCOUNTER — Telehealth: Payer: Self-pay

## 2017-11-17 NOTE — Telephone Encounter (Signed)
Pt calls back regarding test results. Informed of negative strep culture result. He is not feeling much better. Instructed to return for further eval if no improvement.and he verbalized understanding. He will finish his Augmentin course in the meantime

## 2018-01-04 ENCOUNTER — Other Ambulatory Visit: Payer: Self-pay

## 2018-01-31 ENCOUNTER — Telehealth: Payer: Self-pay

## 2018-01-31 ENCOUNTER — Other Ambulatory Visit: Payer: Self-pay

## 2018-01-31 DIAGNOSIS — R350 Frequency of micturition: Principal | ICD-10-CM

## 2018-01-31 DIAGNOSIS — N401 Enlarged prostate with lower urinary tract symptoms: Secondary | ICD-10-CM

## 2018-01-31 MED ORDER — TADALAFIL 5 MG PO TABS: 5.0000 mg | ORAL_TABLET | Freq: Every day | ORAL | 0 refills | Status: DC | PRN

## 2018-01-31 NOTE — Telephone Encounter (Signed)
Pt wanting to change from the cialis 20mg  as needed to Cialis 5mg  daily #90 [facility] to [facility]

## 2018-03-07 ENCOUNTER — Other Ambulatory Visit: Payer: BC Managed Care – PPO

## 2018-03-07 DIAGNOSIS — I10 Essential (primary) hypertension: Secondary | ICD-10-CM

## 2018-03-07 DIAGNOSIS — E349 Endocrine disorder, unspecified: Secondary | ICD-10-CM

## 2018-03-07 DIAGNOSIS — Z202 Contact with and (suspected) exposure to infections with a predominantly sexual mode of transmission: Secondary | ICD-10-CM

## 2018-03-07 DIAGNOSIS — E782 Mixed hyperlipidemia: Secondary | ICD-10-CM

## 2018-03-07 DIAGNOSIS — N401 Enlarged prostate with lower urinary tract symptoms: Secondary | ICD-10-CM

## 2018-03-07 DIAGNOSIS — M791 Myalgia, unspecified site: Secondary | ICD-10-CM

## 2018-03-07 DIAGNOSIS — R69 Illness, unspecified: Secondary | ICD-10-CM

## 2018-03-08 LAB — CBC WITH DIFFERENTIAL/PLATELET
BASOS ABS: 0 10*3/uL (ref 0.0–0.2)
Basos: 1 %
EOS (ABSOLUTE): 0.2 10*3/uL (ref 0.0–0.4)
EOS: 4 %
Hematocrit: 53.3 % — ABNORMAL HIGH (ref 37.5–51.0)
Hemoglobin: 18.5 g/dL — ABNORMAL HIGH (ref 13.0–17.7)
Immature Grans (Abs): 0 10*3/uL (ref 0.0–0.1)
Immature Granulocytes: 1 %
LYMPHS ABS: 1.6 10*3/uL (ref 0.7–3.1)
Lymphs: 25 %
MCH: 33.6 pg — AB (ref 26.6–33.0)
MCHC: 34.7 g/dL (ref 31.5–35.7)
MCV: 97 fL (ref 79–97)
MONOS ABS: 0.6 10*3/uL (ref 0.1–0.9)
Monocytes: 9 %
Neutrophils Absolute: 4.1 10*3/uL (ref 1.4–7.0)
Neutrophils: 60 %
PLATELETS: 168 10*3/uL (ref 150–379)
RBC: 5.51 x10E6/uL (ref 4.14–5.80)
RDW: 13.8 % (ref 12.3–15.4)
WBC: 6.6 10*3/uL (ref 3.4–10.8)

## 2018-03-08 LAB — SEDIMENTATION RATE: Sed Rate: 2 mm/hr (ref 0–30)

## 2018-03-08 LAB — BASIC METABOLIC PANEL
BUN / CREAT RATIO: 15 (ref 9–20)
BUN: 16 mg/dL (ref 6–24)
CHLORIDE: 101 mmol/L (ref 96–106)
CO2: 26 mmol/L (ref 20–29)
Calcium: 9.5 mg/dL (ref 8.7–10.2)
Creatinine, Ser: 1.04 mg/dL (ref 0.76–1.27)
GFR calc Af Amer: 94 mL/min/{1.73_m2} (ref >59)
GFR calc non Af Amer: 81 mL/min/{1.73_m2} (ref >59)
GLUCOSE: 93 mg/dL (ref 65–99)
Potassium: 4.5 mmol/L (ref 3.5–5.2)
SODIUM: 143 mmol/L (ref 134–144)

## 2018-03-08 LAB — HEPATIC FUNCTION PANEL
ALBUMIN: 5 g/dL (ref 3.5–5.5)
ALK PHOS: 68 IU/L (ref 39–117)
ALT: 40 IU/L (ref 0–44)
AST: 24 IU/L (ref 0–40)
Bilirubin Total: 0.9 mg/dL (ref 0.0–1.2)
Bilirubin, Direct: 0.23 mg/dL (ref 0.00–0.40)
TOTAL PROTEIN: 7.3 g/dL (ref 6.0–8.5)

## 2018-03-08 LAB — LIPID PANEL WITH LDL/HDL RATIO
CHOLESTEROL TOTAL: 209 mg/dL — AB (ref 100–199)
HDL: 54 mg/dL (ref >39)
LDL CALC: 128 mg/dL — AB (ref 0–99)
LDl/HDL Ratio: 2.4 ratio (ref 0.0–3.6)
Triglycerides: 135 mg/dL (ref 0–149)
VLDL CHOLESTEROL CAL: 27 mg/dL (ref 5–40)

## 2018-03-08 LAB — HIV ANTIBODY (ROUTINE TESTING W REFLEX): HIV Screen 4th Generation wRfx: NONREACTIVE

## 2018-03-08 LAB — RPR: RPR Ser Ql: NONREACTIVE

## 2018-03-08 LAB — TESTOSTERONE,FREE AND TOTAL
TESTOSTERONE: 946 ng/dL — AB (ref 264–916)
Testosterone, Free: 33.2 pg/mL — ABNORMAL HIGH (ref 7.2–24.0)

## 2018-03-08 LAB — PSA: PROSTATE SPECIFIC AG, SERUM: 1.2 ng/mL (ref 0.0–4.0)

## 2018-03-08 LAB — C-REACTIVE PROTEIN: CRP: 2 mg/L (ref 0.0–4.9)

## 2018-03-09 ENCOUNTER — Ambulatory Visit (INDEPENDENT_AMBULATORY_CARE_PROVIDER_SITE_OTHER): Payer: BC Managed Care – PPO | Admitting: Family Medicine

## 2018-03-09 ENCOUNTER — Encounter: Payer: Self-pay | Admitting: Family Medicine

## 2018-03-09 VITALS — BP 132/80 | HR 72 | Ht 72.0 in | Wt 253.0 lb

## 2018-03-09 DIAGNOSIS — G5603 Carpal tunnel syndrome, bilateral upper limbs: Secondary | ICD-10-CM

## 2018-03-09 DIAGNOSIS — Z0000 Encounter for general adult medical examination without abnormal findings: Secondary | ICD-10-CM | POA: Diagnosis not present

## 2018-03-09 DIAGNOSIS — Z1159 Encounter for screening for other viral diseases: Secondary | ICD-10-CM | POA: Diagnosis not present

## 2018-03-09 DIAGNOSIS — R000 Tachycardia, unspecified: Secondary | ICD-10-CM | POA: Diagnosis not present

## 2018-03-09 DIAGNOSIS — K219 Gastro-esophageal reflux disease without esophagitis: Secondary | ICD-10-CM | POA: Diagnosis not present

## 2018-03-09 DIAGNOSIS — M5090 Cervical disc disorder, unspecified, unspecified cervical region: Secondary | ICD-10-CM | POA: Diagnosis not present

## 2018-03-09 DIAGNOSIS — M7702 Medial epicondylitis, left elbow: Secondary | ICD-10-CM | POA: Diagnosis not present

## 2018-03-09 DIAGNOSIS — R5383 Other fatigue: Secondary | ICD-10-CM | POA: Diagnosis not present

## 2018-03-09 DIAGNOSIS — N401 Enlarged prostate with lower urinary tract symptoms: Secondary | ICD-10-CM | POA: Diagnosis not present

## 2018-03-09 DIAGNOSIS — R350 Frequency of micturition: Secondary | ICD-10-CM

## 2018-03-09 DIAGNOSIS — E785 Hyperlipidemia, unspecified: Secondary | ICD-10-CM

## 2018-03-09 DIAGNOSIS — E291 Testicular hypofunction: Secondary | ICD-10-CM | POA: Diagnosis not present

## 2018-03-09 DIAGNOSIS — I10 Essential (primary) hypertension: Secondary | ICD-10-CM

## 2018-03-09 MED ORDER — TESTOSTERONE CYPIONATE 200 MG/ML IM SOLN: 200.0000 mg | INTRAMUSCULAR | 3 refills | Status: DC

## 2018-03-09 MED ORDER — PANTOPRAZOLE SODIUM 40 MG PO TBEC: 40.0000 mg | DELAYED_RELEASE_TABLET | Freq: Every day | ORAL | 99 refills | Status: DC

## 2018-03-09 MED ORDER — TIZANIDINE HCL 4 MG PO CAPS: 4.0000 mg | ORAL_CAPSULE | Freq: Three times a day (TID) | ORAL | 4 refills | Status: AC

## 2018-03-09 MED ORDER — TADALAFIL 5 MG PO TABS: 5.0000 mg | ORAL_TABLET | Freq: Every day | ORAL | 0 refills | Status: DC | PRN

## 2018-03-09 MED ORDER — METOPROLOL TARTRATE 25 MG PO TABS: 25.0000 mg | ORAL_TABLET | Freq: Two times a day (BID) | ORAL | 1 refills | Status: DC

## 2018-03-09 MED ORDER — LISINOPRIL 10 MG PO TABS: 10.0000 mg | ORAL_TABLET | Freq: Every day | ORAL | 99 refills | Status: DC

## 2018-03-09 MED ORDER — ROSUVASTATIN CALCIUM 10 MG PO TABS
10.0000 mg | ORAL_TABLET | Freq: Every day | ORAL | 99 refills | Status: AC
Start: 2018-03-09 — End: ?

## 2018-03-09 NOTE — Progress Notes (Signed)
Name: Bradley Park   MRN: [id number]    DOB: 1966/12/10   Date:03/09/2018       Progress Note  Subjective  Chief Complaint  Chief Complaint  Patient presents with  . Annual Exam  . Hypertension  . Hyperlipidemia  . Gastroesophageal Reflux  . hypotestosterone    Hypertension  This is a chronic problem. The current episode started more than 1 year ago. The problem is unchanged. Pertinent negatives include no anxiety, blurred vision, chest pain, headaches, malaise/fatigue, neck pain, orthopnea, palpitations, peripheral edema, PND, shortness of breath or sweats. There are no associated agents to hypertension. There are no known risk factors for coronary artery disease. Past treatments include ACE inhibitors. The current treatment provides moderate improvement. There are no compliance problems.  There is no history of angina, kidney disease, CAD/MI, CVA, heart failure, left ventricular hypertrophy, PVD or retinopathy. There is no history of chronic renal disease, a hypertension causing med or renovascular disease.  Hyperlipidemia  This is a chronic problem. The current episode started more than 1 year ago. The problem is controlled. Recent lipid tests were reviewed and are normal. He has no history of chronic renal disease, diabetes, hypothyroidism, liver disease, obesity or nephrotic syndrome. Factors aggravating his hyperlipidemia include beta blockers. Pertinent negatives include no chest pain, focal sensory loss, focal weakness, leg pain, myalgias or shortness of breath. Current antihyperlipidemic treatment includes statins. The current treatment provides moderate improvement of lipids. There are no compliance problems.   Gastroesophageal Reflux  He complains of heartburn. He reports no abdominal pain, no belching, no chest pain, no choking, no coughing, no dysphagia, no nausea, no sore throat or no wheezing. This is a recurrent problem. The problem has been gradually improving. The heartburn is of  mild intensity. The symptoms are aggravated by certain foods. Pertinent negatives include no anemia, fatigue, melena, muscle weakness, orthopnea or weight loss. He has tried a PPI for the symptoms. The treatment provided moderate relief. Past procedures do not include an abdominal ultrasound, an EGD, esophageal manometry, esophageal pH monitoring, H. pylori antibody titer or a UGI.    No problem-specific Assessment & Plan notes found for this encounter.   Past Medical History:  Diagnosis Date  . Asthma   . GERD (gastroesophageal reflux disease)   . Hyperlipidemia   . Hypertension   . Obstructive sleep apnea     Past Surgical History:  Procedure Laterality Date  . SEPTOPLASTY      History reviewed. No pertinent family history.  Social History   Socioeconomic History  . Marital status: Single    Spouse name: Not on file  . Number of children: Not on file  . Years of education: Not on file  . Highest education level: Not on file  Occupational History  . Not on file  Social Needs  . Financial resource strain: Not on file  . Food insecurity:    Worry: Not on file    Inability: Not on file  . Transportation needs:    Medical: Not on file    Non-medical: Not on file  Tobacco Use  . Smoking status: Never Smoker  . Smokeless tobacco: Never Used  Substance and Sexual Activity  . Alcohol use: Yes    Alcohol/week: 0.5 oz    Types: 1 Standard drinks or equivalent per week  . Drug use: No  . Sexual activity: Not on file  Lifestyle  . Physical activity:    Days per week: Not on file  Minutes per session: Not on file  . Stress: Not on file  Relationships  . Social connections:    Talks on phone: Not on file    Gets together: Not on file    Attends religious service: Not on file    Active member of club or organization: Not on file    Attends meetings of clubs or organizations: Not on file    Relationship status: Not on file  . Intimate partner violence:    Fear of  current or ex partner: Not on file    Emotionally abused: Not on file    Physically abused: Not on file    Forced sexual activity: Not on file  Other Topics Concern  . Not on file  Social History Narrative  . Not on file    No Known Allergies  Outpatient Medications Prior to Visit  Medication Sig Dispense Refill  . lisinopril (PRINIVIL,ZESTRIL) 10 MG tablet Take 1 tablet (10 mg total) by mouth daily. 90 tablet PRN  . pantoprazole (PROTONIX) 40 MG tablet Take 1 tablet (40 mg total) by mouth daily. 90 tablet PRN  . rosuvastatin (CRESTOR) 10 MG tablet Take 1 tablet (10 mg total) by mouth daily. 90 tablet PRN  . tadalafil (CIALIS) 20 MG tablet Take 1 tablet (20 mg total) by mouth daily as needed for erectile dysfunction. (Patient taking differently: Take 20 mg by mouth daily as needed for erectile dysfunction. Gets elsewhere) 10 tablet 0  . tadalafil (CIALIS) 5 MG tablet Take 1 tablet (5 mg total) by mouth daily as needed for erectile dysfunction. 90 tablet 0  . testosterone cypionate (DEPOTESTOSTERONE CYPIONATE) 200 MG/ML injection Inject 1 mL (200 mg total) into the muscle every 14 (fourteen) days. 10 mL 3  . tiZANidine (ZANAFLEX) 4 MG capsule Take 1 capsule (4 mg total) by mouth 3 (three) times daily. 30 capsule 4  . amoxicillin-clavulanate (AUGMENTIN) 875-125 MG tablet Take 1 tablet by mouth every 12 (twelve) hours. 20 tablet 0   No facility-administered medications prior to visit.     Review of Systems  Constitutional: Negative for chills, fatigue, fever, malaise/fatigue and weight loss.  HENT: Negative for ear discharge, ear pain and sore throat.   Eyes: Negative for blurred vision.  Respiratory: Negative for cough, sputum production, choking, shortness of breath and wheezing.   Cardiovascular: Negative for chest pain, palpitations, orthopnea, leg swelling and PND.  Gastrointestinal: Positive for heartburn. Negative for abdominal pain, blood in stool, constipation, diarrhea,  dysphagia, melena and nausea.  Genitourinary: Negative for dysuria, frequency, hematuria and urgency.  Musculoskeletal: Negative for back pain, joint pain, myalgias, muscle weakness and neck pain.  Skin: Negative for rash.  Neurological: Negative for dizziness, tingling, sensory change, focal weakness and headaches.  Endo/Heme/Allergies: Negative for environmental allergies and polydipsia. Does not bruise/bleed easily.  Psychiatric/Behavioral: Negative for depression and suicidal ideas. The patient is not nervous/anxious and does not have insomnia.      Objective  Vitals:   03/09/18 0831  BP: 132/80  Pulse: 72  Weight: 253 lb (114.8 kg)  Height: 6' (1.829 m)    Physical Exam  Constitutional: He is oriented to person, place, and time and well-developed, well-nourished, and in no distress. Vital signs are normal.  HENT:  Head: Normocephalic and atraumatic.  Right Ear: Tympanic membrane, external ear and ear canal normal.  Left Ear: Tympanic membrane, external ear and ear canal normal.  Nose: Nose normal.  Mouth/Throat: Uvula is midline, oropharynx is clear and moist and mucous  membranes are normal. No oropharyngeal exudate, posterior oropharyngeal edema or posterior oropharyngeal erythema.  Eyes: Pupils are equal, round, and reactive to light. Conjunctivae and EOM are normal. Right eye exhibits no discharge. Left eye exhibits no discharge. No scleral icterus.  Fundoscopic exam:      The right eye shows no arteriolar narrowing and no AV nicking.       The left eye shows no arteriolar narrowing and no AV nicking.  Neck: Trachea normal, normal range of motion and full passive range of motion without pain. Neck supple. Normal carotid pulses, no hepatojugular reflux and no JVD present. Carotid bruit is not present. No tracheal deviation present. No thyroid mass and no thyromegaly present.  Cardiovascular: Normal rate, regular rhythm, normal heart sounds, intact distal pulses and normal  pulses. Exam reveals no gallop and no friction rub.  No murmur heard. Pulmonary/Chest: Breath sounds normal. No respiratory distress. He has no wheezes. He has no rales. Right breast exhibits no mass. Left breast exhibits no mass.  Abdominal: Soft. Bowel sounds are normal. He exhibits no distension and no mass. There is no hepatosplenomegaly. There is no tenderness. There is no rebound, no guarding and no CVA tenderness.  Genitourinary: Rectum normal, prostate normal, testes/scrotum normal and penis normal. Rectal exam shows guaiac negative stool. He exhibits no abnormal testicular mass, no testicular tenderness, no abnormal scrotal mass and no scrotal tenderness. No discharge found.  Musculoskeletal: Normal range of motion. He exhibits no edema or tenderness.       Lumbar back: Normal.  Lymphadenopathy:       Head (right side): No submental and no submandibular adenopathy present.       Head (left side): No submental and no submandibular adenopathy present.    He has no cervical adenopathy.    He has no axillary adenopathy.  Neurological: He is alert and oriented to person, place, and time. He has normal sensation, normal strength, normal reflexes and intact cranial nerves. No cranial nerve deficit.  Skin: Skin is warm. No rash noted.  Psychiatric: Mood, affect and judgment normal.  Nursing note and vitals reviewed.     Assessment & Plan  Problem List Items Addressed This Visit      Cardiovascular and Mediastinum   Essential hypertension   Relevant Medications   metoprolol tartrate (LOPRESSOR) 25 MG tablet   tadalafil (CIALIS) 5 MG tablet   lisinopril (PRINIVIL,ZESTRIL) 10 MG tablet   rosuvastatin (CRESTOR) 10 MG tablet     Digestive   Gastroesophageal reflux disease   Relevant Medications   pantoprazole (PROTONIX) 40 MG tablet     Endocrine   Hypogonadism in male   Relevant Medications   testosterone cypionate (DEPOTESTOSTERONE CYPIONATE) 200 MG/ML injection      Musculoskeletal and Integument   Cervical disc disease   Relevant Medications   tiZANidine (ZANAFLEX) 4 MG capsule     Other   Hyperlipidemia   Relevant Medications   metoprolol tartrate (LOPRESSOR) 25 MG tablet   tadalafil (CIALIS) 5 MG tablet   lisinopril (PRINIVIL,ZESTRIL) 10 MG tablet   rosuvastatin (CRESTOR) 10 MG tablet    Other Visit Diagnoses    Annual physical exam    -  Primary   Tachycardia       Benign prostatic hyperplasia with urinary frequency       Relevant Medications   tadalafil (CIALIS) 5 MG tablet   Fatigue, unspecified type       Relevant Orders   Thyroid Panel With TSH  Need for hepatitis C screening test       Relevant Orders   Hepatitis C antibody   Carpal tunnel syndrome, bilateral       Relevant Medications   tiZANidine (ZANAFLEX) 4 MG capsule   Medial epicondylitis of left elbow       Relevant Medications   tiZANidine (ZANAFLEX) 4 MG capsule      Meds ordered this encounter  Medications  . metoprolol tartrate (LOPRESSOR) 25 MG tablet    Sig: Take 1 tablet (25 mg total) by mouth 2 (two) times daily.    Dispense:  180 tablet    Refill:  1  . tadalafil (CIALIS) 5 MG tablet    Sig: Take 1 tablet (5 mg total) by mouth daily as needed for erectile dysfunction.    Dispense:  90 tablet    Refill:  0  . tiZANidine (ZANAFLEX) 4 MG capsule    Sig: Take 1 capsule (4 mg total) by mouth 3 (three) times daily.    Dispense:  30 capsule    Refill:  4  . lisinopril (PRINIVIL,ZESTRIL) 10 MG tablet    Sig: Take 1 tablet (10 mg total) by mouth daily.    Dispense:  90 tablet    Refill:  PRN    sched appt  . pantoprazole (PROTONIX) 40 MG tablet    Sig: Take 1 tablet (40 mg total) by mouth daily.    Dispense:  90 tablet    Refill:  PRN  . rosuvastatin (CRESTOR) 10 MG tablet    Sig: Take 1 tablet (10 mg total) by mouth daily.    Dispense:  90 tablet    Refill:  PRN  . testosterone cypionate (DEPOTESTOSTERONE CYPIONATE) 200 MG/ML injection    Sig:  Inject 1 mL (200 mg total) into the muscle every 14 (fourteen) days.    Dispense:  10 mL    Refill:  3      Dr. Elizabeth Sauereanna Jones [facility]  03/09/18

## 2018-03-14 ENCOUNTER — Other Ambulatory Visit: Payer: Self-pay

## 2018-05-01 ENCOUNTER — Other Ambulatory Visit: Payer: Self-pay | Admitting: Family Medicine

## 2018-07-23 ENCOUNTER — Other Ambulatory Visit: Payer: Self-pay

## 2018-07-23 DIAGNOSIS — N401 Enlarged prostate with lower urinary tract symptoms: Secondary | ICD-10-CM

## 2018-07-23 DIAGNOSIS — K219 Gastro-esophageal reflux disease without esophagitis: Secondary | ICD-10-CM

## 2018-07-23 DIAGNOSIS — I10 Essential (primary) hypertension: Secondary | ICD-10-CM

## 2018-07-23 DIAGNOSIS — R350 Frequency of micturition: Secondary | ICD-10-CM

## 2018-07-23 MED ORDER — LISINOPRIL 10 MG PO TABS: 10.0000 mg | ORAL_TABLET | Freq: Every day | ORAL | 0 refills | Status: AC

## 2018-07-23 MED ORDER — PANTOPRAZOLE SODIUM 40 MG PO TBEC: 40.0000 mg | DELAYED_RELEASE_TABLET | Freq: Every day | ORAL | 0 refills | Status: AC

## 2018-07-23 MED ORDER — TADALAFIL 5 MG PO TABS: 5.0000 mg | ORAL_TABLET | Freq: Every day | ORAL | 0 refills | Status: AC | PRN

## 2018-07-23 MED ORDER — METOPROLOL TARTRATE 25 MG PO TABS
25.0000 mg | ORAL_TABLET | Freq: Two times a day (BID) | ORAL | 0 refills | Status: AC
Start: 2018-07-23 — End: ?

## 2018-07-31 ENCOUNTER — Other Ambulatory Visit: Payer: Self-pay

## 2018-07-31 DIAGNOSIS — E291 Testicular hypofunction: Secondary | ICD-10-CM

## 2018-07-31 MED ORDER — TESTOSTERONE CYPIONATE 200 MG/ML IM SOLN
200.0000 mg | INTRAMUSCULAR | 0 refills | Status: AC
Start: 2018-07-31 — End: ?

## 2019-03-15 ENCOUNTER — Encounter: Payer: BC Managed Care – PPO | Admitting: Family Medicine
